# Patient Record
Sex: Male | Born: 1995 | Race: White | Hispanic: No | Marital: Single | Smoking: Current every day smoker
Health system: Southern US, Community
[De-identification: ages and names within clinical notes are randomized; demographics above are authoritative.]

## PROBLEM LIST (undated history)

## (undated) DIAGNOSIS — F32A Depression, unspecified: Secondary | ICD-10-CM

## (undated) DIAGNOSIS — F6381 Intermittent explosive disorder: Secondary | ICD-10-CM

## (undated) DIAGNOSIS — F988 Other specified behavioral and emotional disorders with onset usually occurring in childhood and adolescence: Secondary | ICD-10-CM

## (undated) DIAGNOSIS — G4700 Insomnia, unspecified: Secondary | ICD-10-CM

## (undated) DIAGNOSIS — F329 Major depressive disorder, single episode, unspecified: Secondary | ICD-10-CM

## (undated) DIAGNOSIS — F319 Bipolar disorder, unspecified: Secondary | ICD-10-CM

## (undated) DIAGNOSIS — F4310 Post-traumatic stress disorder, unspecified: Secondary | ICD-10-CM

## (undated) HISTORY — DX: Depression, unspecified: F32.A

## (undated) HISTORY — DX: Insomnia, unspecified: G47.00

## (undated) HISTORY — DX: Post-traumatic stress disorder, unspecified: F43.10

## (undated) HISTORY — DX: Bipolar disorder, unspecified: F31.9

## (undated) HISTORY — DX: Major depressive disorder, single episode, unspecified: F32.9

## (undated) HISTORY — DX: Intermittent explosive disorder: F63.81

## (undated) HISTORY — DX: Other specified behavioral and emotional disorders with onset usually occurring in childhood and adolescence: F98.8

---
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
## 2016-01-11 ENCOUNTER — Ambulatory Visit (INDEPENDENT_AMBULATORY_CARE_PROVIDER_SITE_OTHER): Payer: Medicaid Other | Admitting: Family Medicine

## 2016-01-11 ENCOUNTER — Encounter: Payer: Self-pay | Admitting: Family Medicine

## 2016-01-11 VITALS — BP 114/70 | HR 76 | Temp 97.9°F | Ht 72.0 in | Wt 185.4 lb

## 2016-01-11 DIAGNOSIS — F4310 Post-traumatic stress disorder, unspecified: Secondary | ICD-10-CM

## 2016-01-11 DIAGNOSIS — F419 Anxiety disorder, unspecified: Principal | ICD-10-CM

## 2016-01-11 DIAGNOSIS — F3181 Bipolar II disorder: Secondary | ICD-10-CM | POA: Insufficient documentation

## 2016-01-11 DIAGNOSIS — F329 Major depressive disorder, single episode, unspecified: Secondary | ICD-10-CM

## 2016-01-11 DIAGNOSIS — F418 Other specified anxiety disorders: Secondary | ICD-10-CM | POA: Diagnosis not present

## 2016-01-11 MED ORDER — QUETIAPINE FUMARATE ER 300 MG PO TB24: 300.0000 mg | ORAL_TABLET | Freq: Every day | ORAL | Status: DC

## 2016-01-11 MED ORDER — ESCITALOPRAM OXALATE 20 MG PO TABS: 20.0000 mg | ORAL_TABLET | Freq: Every day | ORAL | Status: DC

## 2016-01-11 NOTE — Progress Notes (Signed)
BP 114/70 mmHg  Pulse 76  Temp(Src) 97.9 F (36.6 C) (Oral)  Ht 6' (1.829 m)  Wt 185 lb 6.4 oz (84.097 kg)  BMI 25.14 kg/m2   Subjective:    Patient ID: Matthew Cooke, male    DOB: 12-Jan-1996, 20 y.o.   MRN: [id number]  HPI: Matthew Cooke is a 20 y.o. male presenting on 01/11/2016 for Depression and Anxiety   HPI Anxiety and mood disorder Patient has been having more increased episodes of hyper-energy where he stays up for a day or 2 as he was not having those last time I talked to him at all or if he didn't lasted less than an hour but now they're lasting full-day or 2 days. He then has swings where he is down and depressed for 3 or 4 days in a row. He feels like this is been worse since his been on the Lexapro. He denies any suicidal ideations now though so that has improved. He has not yet seen a counselor for this yet. He says he still having a lot of issues with sleeping.  Relevant past medical, surgical, family and social history reviewed and updated as indicated. Interim medical history since our last visit reviewed. Allergies and medications reviewed and updated.  Review of Systems  Constitutional: Negative for fever.  HENT: Negative for ear discharge and ear pain.   Eyes: Negative for discharge and visual disturbance.  Respiratory: Negative for shortness of breath and wheezing.   Cardiovascular: Negative for chest pain and leg swelling.  Gastrointestinal: Negative for abdominal pain, diarrhea and constipation.  Genitourinary: Negative for difficulty urinating.  Musculoskeletal: Negative for back pain and gait problem.  Skin: Negative for rash.  Neurological: Negative for syncope, light-headedness and headaches.  Psychiatric/Behavioral: Positive for dysphoric mood, decreased concentration and agitation. Negative for suicidal ideas, sleep disturbance and self-injury. The patient is nervous/anxious.   All other systems reviewed and are negative.   Per HPI unless  specifically indicated above     Medication List       This list is accurate as of: 01/11/16 11:41 AM.  Always use your most recent med list.               escitalopram 10 MG tablet  Commonly known as:  LEXAPRO  Take 1 tablet (10 mg total) by mouth daily.     QUEtiapine 300 MG 24 hr tablet  Commonly known as:  SEROQUEL XR  Take 1 tablet (300 mg total) by mouth at bedtime.           Objective:    BP 114/70 mmHg  Pulse 76  Temp(Src) 97.9 F (36.6 C) (Oral)  Ht 6' (1.829 m)  Wt 185 lb 6.4 oz (84.097 kg)  BMI 25.14 kg/m2  Wt Readings from Last 3 Encounters:  01/11/16 185 lb 6.4 oz (84.097 kg) (87 %*, Z = 1.14)  12/14/15 190 lb 3.2 oz (86.274 kg) (90 %*, Z = 1.28)  12/06/15 187 lb 9.6 oz (85.095 kg) (89 %*, Z = 1.21)   * Growth percentiles are based on CDC 2-20 Years data.    Physical Exam  Constitutional: He is oriented to person, place, and time. He appears well-developed and well-nourished. No distress.  Eyes: Conjunctivae and EOM are normal. Pupils are equal, round, and reactive to light. Right eye exhibits no discharge. No scleral icterus.  Neck: Neck supple. No thyromegaly present.  Cardiovascular: Normal rate, regular rhythm, normal heart sounds and intact distal pulses.   No  murmur heard. Pulmonary/Chest: Effort normal and breath sounds normal. No respiratory distress. He has no wheezes.  Musculoskeletal: Normal range of motion. He exhibits no edema.  Lymphadenopathy:    He has no cervical adenopathy.  Neurological: He is alert and oriented to person, place, and time. Coordination normal.  Skin: Skin is warm and dry. No rash noted. He is not diaphoretic.  Psychiatric: His behavior is normal. Judgment and thought content normal. His mood appears anxious. His affect is labile. He exhibits a depressed mood. He expresses no suicidal ideation. He expresses no suicidal plans.  Nursing note and vitals reviewed.   No results found for this or any previous visit.      Assessment & Plan:   Problem List Items Addressed This Visit      Other   Anxiety and depression - Primary   Relevant Medications   QUEtiapine (SEROQUEL XR) 300 MG 24 hr tablet   PTSD (post-traumatic stress disorder)   Relevant Medications   QUEtiapine (SEROQUEL XR) 300 MG 24 hr tablet   Bipolar 2 disorder (HCC)   Relevant Medications   QUEtiapine (SEROQUEL XR) 300 MG 24 hr tablet       Follow up plan: Return in about 4 weeks (around 02/08/2016), or if symptoms worsen or fail to improve, for Recheck bipolar and anxiety and depression.  Counseling provided for all of the vaccine components No orders of the defined types were placed in this encounter.    Arville Care, MD [facility] 01/11/2016, 11:41 AM

## 2016-01-12 ENCOUNTER — Telehealth: Payer: Self-pay

## 2016-01-12 NOTE — Telephone Encounter (Signed)
Medicaid authorized Seroquel XR  [id number] till 01/06/17

## 2016-02-13 ENCOUNTER — Ambulatory Visit: Payer: Medicaid Other | Admitting: Family Medicine

## 2016-02-14 ENCOUNTER — Encounter: Payer: Self-pay | Admitting: Family Medicine

## 2016-04-04 ENCOUNTER — Other Ambulatory Visit: Payer: Self-pay | Admitting: Family Medicine

## 2016-04-04 DIAGNOSIS — F419 Anxiety disorder, unspecified: Principal | ICD-10-CM

## 2016-04-04 DIAGNOSIS — F4310 Post-traumatic stress disorder, unspecified: Secondary | ICD-10-CM

## 2016-04-04 DIAGNOSIS — F329 Major depressive disorder, single episode, unspecified: Secondary | ICD-10-CM

## 2016-04-04 DIAGNOSIS — F3181 Bipolar II disorder: Secondary | ICD-10-CM

## 2016-04-05 ENCOUNTER — Other Ambulatory Visit: Payer: Self-pay | Admitting: Family Medicine

## 2016-04-05 DIAGNOSIS — F419 Anxiety disorder, unspecified: Principal | ICD-10-CM

## 2016-04-05 DIAGNOSIS — F3181 Bipolar II disorder: Secondary | ICD-10-CM

## 2016-04-05 DIAGNOSIS — F4310 Post-traumatic stress disorder, unspecified: Secondary | ICD-10-CM

## 2016-04-05 DIAGNOSIS — F32A Depression, unspecified: Secondary | ICD-10-CM

## 2016-04-05 DIAGNOSIS — F329 Major depressive disorder, single episode, unspecified: Secondary | ICD-10-CM

## 2016-04-08 ENCOUNTER — Other Ambulatory Visit: Payer: Self-pay | Admitting: Family Medicine

## 2016-04-08 DIAGNOSIS — F3181 Bipolar II disorder: Secondary | ICD-10-CM

## 2016-04-08 DIAGNOSIS — F419 Anxiety disorder, unspecified: Principal | ICD-10-CM

## 2016-04-08 DIAGNOSIS — F4310 Post-traumatic stress disorder, unspecified: Secondary | ICD-10-CM

## 2016-04-08 DIAGNOSIS — F329 Major depressive disorder, single episode, unspecified: Secondary | ICD-10-CM

## 2016-06-02 ENCOUNTER — Other Ambulatory Visit: Payer: Self-pay | Admitting: Family Medicine

## 2016-06-02 DIAGNOSIS — F419 Anxiety disorder, unspecified: Principal | ICD-10-CM

## 2016-06-02 DIAGNOSIS — F4310 Post-traumatic stress disorder, unspecified: Secondary | ICD-10-CM

## 2016-06-02 DIAGNOSIS — F329 Major depressive disorder, single episode, unspecified: Secondary | ICD-10-CM

## 2016-06-02 DIAGNOSIS — F3181 Bipolar II disorder: Secondary | ICD-10-CM

## 2016-06-19 ENCOUNTER — Encounter: Payer: Self-pay | Admitting: Pediatrics

## 2016-06-19 ENCOUNTER — Ambulatory Visit (INDEPENDENT_AMBULATORY_CARE_PROVIDER_SITE_OTHER): Payer: Medicaid Other | Admitting: Pediatrics

## 2016-06-19 ENCOUNTER — Ambulatory Visit: Payer: Medicaid Other | Admitting: Family Medicine

## 2016-06-19 VITALS — BP 121/72 | HR 108 | Temp 99.7°F | Ht 72.0 in | Wt 206.0 lb

## 2016-06-19 DIAGNOSIS — F419 Anxiety disorder, unspecified: Secondary | ICD-10-CM

## 2016-06-19 DIAGNOSIS — J029 Acute pharyngitis, unspecified: Secondary | ICD-10-CM

## 2016-06-19 DIAGNOSIS — R52 Pain, unspecified: Secondary | ICD-10-CM | POA: Diagnosis not present

## 2016-06-19 DIAGNOSIS — F418 Other specified anxiety disorders: Secondary | ICD-10-CM | POA: Diagnosis not present

## 2016-06-19 DIAGNOSIS — J101 Influenza due to other identified influenza virus with other respiratory manifestations: Secondary | ICD-10-CM | POA: Diagnosis not present

## 2016-06-19 DIAGNOSIS — J020 Streptococcal pharyngitis: Secondary | ICD-10-CM | POA: Diagnosis not present

## 2016-06-19 DIAGNOSIS — F329 Major depressive disorder, single episode, unspecified: Secondary | ICD-10-CM

## 2016-06-19 LAB — VERITOR FLU A/B WAIVED
INFLUENZA A: POSITIVE — AB
INFLUENZA B: NEGATIVE

## 2016-06-19 LAB — RAPID STREP SCREEN (MED CTR MEBANE ONLY): Strep Gp A Ag, IA W/Reflex: POSITIVE — AB

## 2016-06-19 MED ORDER — OSELTAMIVIR PHOSPHATE 75 MG PO CAPS: 75.0000 mg | ORAL_CAPSULE | Freq: Two times a day (BID) | ORAL | 0 refills | Status: DC

## 2016-06-19 MED ORDER — AMOXICILLIN 500 MG PO CAPS: 500.0000 mg | ORAL_CAPSULE | Freq: Two times a day (BID) | ORAL | 0 refills | Status: AC

## 2016-06-19 NOTE — Progress Notes (Signed)
  Subjective:   Patient ID: Matthew Cooke, male    DOB: [birth year], 20 y.o.   MRN: [id number] CC: Cough; Fever; and Chills  HPI: Matthew Cooke is a 20 y.o. male presenting for Cough; Fever; and Chills  Started getting sick 3 days ago Fevers at home, subjective Coughing bothering him the most Feeling weak and tired Drinking some Throat started getting sore two days  Mood has been down Feels safe at home Has had thoughts of not wanting to be here anymore several weeks ago   [facility] 06/19/2016 01/11/2016 12/14/2015 12/06/2015  Decreased Interest 3 3 3 3   Down, Depressed, Hopeless 3 3 3 3   PHQ - 2 Score 6 6 6 6   Altered sleeping 2 3 1 2   Tired, decreased energy 2 3 3 3   Change in appetite 2 0 2 2  Feeling bad or failure about yourself  3 3 3 3   Trouble concentrating 2 2 3 3   Moving slowly or fidgety/restless - 3 3 2   Suicidal thoughts 2 2 3 2   PHQ-9 Score 19 22 24 23   Difficult doing work/chores Very difficult Very difficult Extremely dIfficult -      Relevant past medical, surgical, family and social history reviewed. Allergies and medications reviewed and updated. History  Smoking Status  . Never Smoker  Smokeless Tobacco  . Not on file   ROS: Per HPI   Objective:    BP 121/72   Pulse (!) 108   Temp 99.7 F (37.6 C) (Oral)   Ht 6' (1.829 m)   Wt 206 lb (93.4 kg)   BMI 27.94 kg/m   Wt Readings from Last 3 Encounters:  06/19/16 206 lb (93.4 kg) (95 %, Z= 1.61)*  01/11/16 185 lb 6.4 oz (84.1 kg) (87 %, Z= 1.14)*  12/14/15 190 lb 3.2 oz (86.3 kg) (90 %, Z= 1.28)*   * Growth percentiles are based on CDC 2-20 Years data.    Gen: NAD, alert, cooperative with exam, NCAT EYES: EOMI, no conjunctival injection, or no icterus ENT:  TMs pink b/l, OP with mild erythema LYMPH: no cervical LAD CV: NRRR, normal S1/S2, no murmur Resp: CTABL, no wheezes, normal WOB Neuro: Alert and oriented Psych: normal affect, no thoughts of self harm  Assessment &  Plan:  Matthew NeedleMichael was seen today for cough, fever and chills. Positive for flu and strep Will treat as below Discussed symptomatic care. Also with ongoing depression Feels safe at home, symptoms poorly controlled Will start above treatments, RTC end of week for eval of depression Pt to call us, crisis numbers, go to ED if any worsening of symptoms.  Diagnoses and all orders for this visit:  Influenza A -     oseltamivir (TAMIFLU) 75 MG capsule; Take 1 capsule (75 mg total) by mouth 2 (two) times daily.  Body aches -     Veritor Flu A/B Waived  Sore throat -     Rapid strep screen ([facility])  Streptococcal sore throat -     amoxicillin (AMOXIL) 500 MG capsule; Take 1 capsule (500 mg total) by mouth 2 (two) times daily.  Anxiety and depression   Follow up plan: Friday as scheduled Rex Krasarol Carlisa Eble, MD [facility]

## 2016-06-22 ENCOUNTER — Ambulatory Visit: Payer: Medicaid Other | Admitting: Pediatrics

## 2016-06-26 ENCOUNTER — Ambulatory Visit: Payer: Medicaid Other | Admitting: Pediatrics

## 2016-06-26 ENCOUNTER — Encounter: Payer: Self-pay | Admitting: Family Medicine

## 2016-06-27 ENCOUNTER — Encounter: Payer: Self-pay | Admitting: Family Medicine

## 2016-08-03 ENCOUNTER — Other Ambulatory Visit: Payer: Self-pay | Admitting: Family Medicine

## 2016-08-03 DIAGNOSIS — F419 Anxiety disorder, unspecified: Principal | ICD-10-CM

## 2016-08-03 DIAGNOSIS — F4310 Post-traumatic stress disorder, unspecified: Secondary | ICD-10-CM

## 2016-08-03 DIAGNOSIS — F329 Major depressive disorder, single episode, unspecified: Secondary | ICD-10-CM

## 2016-08-03 DIAGNOSIS — F3181 Bipolar II disorder: Secondary | ICD-10-CM

## 2016-08-03 DIAGNOSIS — F32A Depression, unspecified: Secondary | ICD-10-CM

## 2016-08-05 NOTE — Telephone Encounter (Signed)
We may give one months worth, he needs appt asap

## 2016-08-06 NOTE — Telephone Encounter (Signed)
LM NTBS before next refill 

## 2016-08-21 ENCOUNTER — Encounter: Payer: Self-pay | Admitting: Physician Assistant

## 2016-08-21 ENCOUNTER — Ambulatory Visit (INDEPENDENT_AMBULATORY_CARE_PROVIDER_SITE_OTHER): Payer: Medicaid Other | Admitting: Physician Assistant

## 2016-08-21 VITALS — BP 112/72 | HR 96 | Temp 99.5°F | Ht 72.02 in | Wt 205.4 lb

## 2016-08-21 DIAGNOSIS — J101 Influenza due to other identified influenza virus with other respiratory manifestations: Secondary | ICD-10-CM | POA: Diagnosis not present

## 2016-08-21 DIAGNOSIS — R52 Pain, unspecified: Secondary | ICD-10-CM

## 2016-08-21 LAB — VERITOR FLU A/B WAIVED
INFLUENZA A: NEGATIVE
Influenza B: POSITIVE — AB

## 2016-08-21 MED ORDER — OSELTAMIVIR PHOSPHATE 75 MG PO CAPS: 75.0000 mg | ORAL_CAPSULE | Freq: Two times a day (BID) | ORAL | 0 refills | Status: DC

## 2016-08-21 NOTE — Progress Notes (Signed)
BP 112/72   Pulse 96   Temp 99.5 F (37.5 C) (Oral)   Ht 6' 0.02" (1.829 m)   Wt 205 lb 6.4 oz (93.2 kg)   BMI 27.84 kg/m    Subjective:    Patient ID: Matthew Cooke, male    DOB: 1995-07-24, 21 y.o.   MRN: [id number]  HPI: Matthew Cooke is a 21 y.o. male presenting on 08/21/2016 for Generalized Body Aches; Fever; Cough; Chills; and Diarrhea  This patient has had less than 2 days severe fever, chills, myalgias.  Complains of sinus headache and postnasal drainage. There is copious drainage at times. Associated sore throat, decreased appetite and headache.  Has been exposed to influenza.   Relevant past medical, surgical, family and social history reviewed and updated as indicated. Allergies and medications reviewed and updated.  Past Medical History:  Diagnosis Date  . Attention deficit disorder   . Bipolar 1 disorder (HCC)   . Depression   . Insomnia   . Intermittent explosive disorder   . PTSD (post-traumatic stress disorder)     History reviewed. No pertinent surgical history.  Review of Systems  Constitutional: Positive for activity change, fatigue and fever. Negative for appetite change.  HENT: Positive for congestion and sore throat. Negative for sinus pressure.   Eyes: Negative.  Negative for pain and visual disturbance.  Respiratory: Negative for cough, chest tightness, shortness of breath and wheezing.   Cardiovascular: Negative.  Negative for chest pain, palpitations and leg swelling.  Gastrointestinal: Positive for nausea and vomiting. Negative for abdominal pain and diarrhea.  Endocrine: Negative.   Genitourinary: Negative.   Musculoskeletal: Positive for back pain and myalgias. Negative for arthralgias.  Skin: Negative.  Negative for color change and rash.  Neurological: Positive for headaches. Negative for weakness and numbness.  Psychiatric/Behavioral: Negative.     Allergies as of 08/21/2016   No Known Allergies     Medication List         Accurate as of 08/21/16 10:32 AM. Always use your most recent med list.          escitalopram 20 MG tablet Commonly known as:  LEXAPRO Take 1 tablet (20 mg total) by mouth daily.   oseltamivir 75 MG capsule Commonly known as:  TAMIFLU Take 1 capsule (75 mg total) by mouth 2 (two) times daily.   SEROQUEL XR 300 MG 24 hr tablet Generic drug:  QUEtiapine TAKE 1 TABLET (300 MG TOTAL) BY MOUTH AT BEDTIME.          Objective:    BP 112/72   Pulse 96   Temp 99.5 F (37.5 C) (Oral)   Ht 6' 0.02" (1.829 m)   Wt 205 lb 6.4 oz (93.2 kg)   BMI 27.84 kg/m   No Known Allergies  Physical Exam  Constitutional: He is oriented to person, place, and time. He appears well-developed and well-nourished. He appears distressed.  HENT:  Head: Normocephalic and atraumatic.  Right Ear: Tympanic membrane normal. No drainage. No middle ear effusion.  Left Ear: Tympanic membrane normal. No drainage.  No middle ear effusion.  Nose: Mucosal edema and rhinorrhea present. Right sinus exhibits no maxillary sinus tenderness. Left sinus exhibits no maxillary sinus tenderness.  Mouth/Throat: Uvula is midline. Posterior oropharyngeal erythema present. No oropharyngeal exudate.  Eyes: Conjunctivae and EOM are normal. Pupils are equal, round, and reactive to light. Right eye exhibits no discharge. Left eye exhibits no discharge.  Neck: Normal range of motion.  Cardiovascular: Normal rate, regular rhythm  and normal heart sounds.   Pulmonary/Chest: Effort normal and breath sounds normal. No respiratory distress. He has no wheezes.  Abdominal: Soft.  Lymphadenopathy:    He has no cervical adenopathy.  Neurological: He is alert and oriented to person, place, and time.  Skin: Skin is warm and dry.  Psychiatric: He has a normal mood and affect. His behavior is normal.  Nursing note and vitals reviewed.   Results for orders placed or performed in visit on 06/19/16  Veritor Flu A/B Waived  Result Value Ref  Range   Influenza A Positive (A) Negative   Influenza B Negative Negative  [facility]apid [facility] screen (not [facility])  Result Value Ref Range   Strep Gp A Ag, IA W/Reflex Positive (A) Negative      Assessment & Plan:   1. Body aches - Veritor Flu A/B Waived  2. Influenza B - oseltamivir (TAMIFLU) 75 MG capsule; Take 1 capsule (75 mg total) by mouth 2 (two) times daily.  Dispense: 10 capsule; Refill: 0   Continue all other maintenance medications as listed above.  Follow up plan: Return if symptoms worsen or fail to improve.  Educational handout given for influenza  Remus LofflerAngel S. Kaybree Williams PA-C [facility] [address]  [place] [id number] [phone]   08/21/2016, 10:32 AM

## 2016-08-21 NOTE — Patient Instructions (Signed)

## 2016-08-23 ENCOUNTER — Ambulatory Visit (INDEPENDENT_AMBULATORY_CARE_PROVIDER_SITE_OTHER): Payer: Medicaid Other | Admitting: Family Medicine

## 2016-08-23 ENCOUNTER — Encounter: Payer: Self-pay | Admitting: Family Medicine

## 2016-08-23 VITALS — BP 115/64 | HR 77 | Temp 97.6°F | Ht 72.0 in | Wt 204.4 lb

## 2016-08-23 DIAGNOSIS — F419 Anxiety disorder, unspecified: Principal | ICD-10-CM

## 2016-08-23 DIAGNOSIS — F4310 Post-traumatic stress disorder, unspecified: Secondary | ICD-10-CM

## 2016-08-23 DIAGNOSIS — F418 Other specified anxiety disorders: Secondary | ICD-10-CM | POA: Diagnosis not present

## 2016-08-23 DIAGNOSIS — F3181 Bipolar II disorder: Secondary | ICD-10-CM

## 2016-08-23 DIAGNOSIS — F32A Depression, unspecified: Secondary | ICD-10-CM

## 2016-08-23 DIAGNOSIS — F329 Major depressive disorder, single episode, unspecified: Secondary | ICD-10-CM

## 2016-08-23 MED ORDER — QUETIAPINE FUMARATE ER 300 MG PO TB24
600.0000 mg | ORAL_TABLET | Freq: Every day | ORAL | 2 refills | Status: DC
Start: 2016-08-23 — End: 2016-11-20

## 2016-08-23 MED ORDER — ESCITALOPRAM OXALATE 20 MG PO TABS: 20.0000 mg | ORAL_TABLET | Freq: Every day | ORAL | 2 refills | Status: AC
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
## 2016-08-28 ENCOUNTER — Ambulatory Visit (INDEPENDENT_AMBULATORY_CARE_PROVIDER_SITE_OTHER): Payer: Medicaid Other | Admitting: Family Medicine

## 2016-08-28 ENCOUNTER — Ambulatory Visit (INDEPENDENT_AMBULATORY_CARE_PROVIDER_SITE_OTHER): Payer: Medicaid Other

## 2016-08-28 ENCOUNTER — Encounter: Payer: Self-pay | Admitting: Family Medicine

## 2016-08-28 VITALS — BP 111/59 | HR 99 | Temp 98.4°F | Ht 72.0 in | Wt 206.0 lb

## 2016-08-28 DIAGNOSIS — R1032 Left lower quadrant pain: Secondary | ICD-10-CM | POA: Diagnosis not present

## 2016-08-28 DIAGNOSIS — R195 Other fecal abnormalities: Secondary | ICD-10-CM | POA: Diagnosis not present

## 2016-08-28 LAB — URINALYSIS, COMPLETE
Bilirubin, UA: NEGATIVE
Glucose, UA: NEGATIVE
Ketones, UA: NEGATIVE
LEUKOCYTES UA: NEGATIVE
Nitrite, UA: NEGATIVE
PH UA: 8.5 — AB (ref 5.0–7.5)
Protein, UA: NEGATIVE
RBC, UA: NEGATIVE
Specific Gravity, UA: 1.015 (ref 1.005–1.030)
Urobilinogen, Ur: 0.2 mg/dL (ref 0.2–1.0)

## 2016-08-28 LAB — MICROSCOPIC EXAMINATION
Bacteria, UA: NONE SEEN
EPITHELIAL CELLS (NON RENAL): NONE SEEN /HPF (ref 0–10)
RBC, UA: NONE SEEN /hpf (ref 0–?)
RENAL EPITHEL UA: NONE SEEN /HPF
WBC, UA: NONE SEEN /hpf (ref 0–?)

## 2016-08-28 MED ORDER — POLYETHYLENE GLYCOL 3350 17 GM/SCOOP PO POWD: 17.0000 g | Freq: Two times a day (BID) | ORAL | 5 refills | Status: AC | PRN

## 2016-08-28 NOTE — Progress Notes (Signed)
Subjective:  Patient ID: Matthew Cooke, male    DOB: 10/16/1995  Age: 21 y.o. MRN: [id number]  CC: Flank Pain (pt here today c/o left side pain since last night.)   HPI Matthew Cooke presents for Onset yesterday afternoon point specific left lower quadrant pain. He has had a slow increase through the evening yesterday and overnight until pain crescendoed this morning. He describes a sharp pain. It is located just adjacent to the anterior superior iliac spine on the left. It hurts to touch. It hurts when he bends forward or straightens his abdomen at the waist. He's not had any nausea vomiting diarrhea or constipation. No injury. History Matthew Cooke has a past medical history of Attention deficit disorder; Bipolar 1 disorder ([place]); Depression; Insomnia; Intermittent explosive disorder; and PTSD (post-traumatic stress disorder).   He has no past surgical history on file.   His family history includes Bipolar disorder in his mother; Depression in his mother and sister; Insomnia in his mother; Post-traumatic stress disorder in his mother; Schizophrenia in his mother.He reports that he has been smoking.  He has never used smokeless tobacco. He reports that he does not drink alcohol or use drugs.    ROS Review of Systems  Constitutional: Negative for chills, diaphoresis, fever and unexpected weight change.  HENT: Negative for rhinorrhea and trouble swallowing.   Respiratory: Negative for cough, chest tightness and shortness of breath.   Cardiovascular: Negative for chest pain.  Gastrointestinal: Positive for abdominal pain. Negative for abdominal distention, blood in stool, constipation, diarrhea, nausea, rectal pain and vomiting.  Genitourinary: Negative for dysuria, flank pain and hematuria.  Musculoskeletal: Negative for arthralgias and joint swelling.  Skin: Negative for rash.  Neurological: Negative for syncope and headaches.    Objective:  BP (!) 111/59   Pulse 99   Temp 98.4 F  (36.9 C) (Oral)   Ht 6' (1.829 m)   Wt 206 lb (93.4 kg)   BMI 27.94 kg/m   BP Readings from Last 3 Encounters:  08/28/16 (!) 111/59  08/23/16 115/64  08/21/16 112/72    Wt Readings from Last 3 Encounters:  08/28/16 206 lb (93.4 kg) (94 %, Z= 1.59)*  08/23/16 204 lb 6.4 oz (92.7 kg) (94 %, Z= 1.56)*  08/21/16 205 lb 6.4 oz (93.2 kg) (94 %, Z= 1.58)*   * Growth percentiles are based on CDC 2-20 Years data.     Physical Exam  Constitutional: He is oriented to person, place, and time. He appears well-developed and well-nourished. He appears distressed (due to lower abdominal pain.).  HENT:  Head: Normocephalic and atraumatic.  Right Ear: Tympanic membrane and external ear normal. No decreased hearing is noted.  Left Ear: Tympanic membrane and external ear normal. No decreased hearing is noted.  Mouth/Throat: No oropharyngeal exudate or posterior oropharyngeal erythema.  Eyes: Pupils are equal, round, and reactive to light.  Neck: Normal range of motion. Neck supple.  Cardiovascular: Normal rate and regular rhythm.   No murmur heard. Pulmonary/Chest: Breath sounds normal. No respiratory distress.  Abdominal: Soft. Bowel sounds are normal. He exhibits no mass. There is tenderness. There is rebound and guarding.  Musculoskeletal: Normal range of motion.  Neurological: He is alert and oriented to person, place, and time.  Skin: Skin is warm and dry.  Psychiatric: He has a normal mood and affect.  Vitals reviewed.   Patient was never admitted.  Assessment & Plan:   Kentarius was seen today for flank pain.  Diagnoses and all orders for  this visit:  LLQ pain -     CBC with Differential/Platelet -     CMP14+EGFR -     DG Abd 2 Views; Future -     Urinalysis, Complete  Increased stool volume  Other orders -     polyethylene glycol powder (GLYCOLAX/MIRALAX) powder; Take 17 g by mouth 2 (two) times daily as needed for moderate constipation. For constipation     I am  having Mr. Urbanik start on polyethylene glycol powder. I am also having him maintain his QUEtiapine and escitalopram.  Allergies as of 08/28/2016   No Known Allergies     Medication List       Accurate as of 08/28/16  5:31 PM. Always use your most recent med list.          escitalopram 20 MG tablet Commonly known as:  LEXAPRO Take 1 tablet (20 mg total) by mouth daily.   polyethylene glycol powder powder Commonly known as:  GLYCOLAX/MIRALAX Take 17 g by mouth 2 (two) times daily as needed for moderate constipation. For constipation   QUEtiapine 300 MG 24 hr tablet Commonly known as:  SEROQUEL XR Take 2 tablets (600 mg total) by mouth at bedtime.        Follow-up: Return if symptoms worsen or fail to improve.  Claretta Fraise, M.D.

## 2016-08-29 LAB — CMP14+EGFR
A/G RATIO: 1.9 (ref 1.2–2.2)
ALBUMIN: 4.8 g/dL (ref 3.5–5.5)
ALK PHOS: 67 IU/L (ref 39–117)
ALT: 71 IU/L — ABNORMAL HIGH (ref 0–44)
AST: 31 IU/L (ref 0–40)
BILIRUBIN TOTAL: 0.6 mg/dL (ref 0.0–1.2)
BUN / CREAT RATIO: 17 (ref 9–20)
BUN: 11 mg/dL (ref 6–20)
CHLORIDE: 101 mmol/L (ref 96–106)
CO2: 25 mmol/L (ref 18–29)
Calcium: 9.5 mg/dL (ref 8.7–10.2)
Creatinine, Ser: 0.64 mg/dL — ABNORMAL LOW (ref 0.76–1.27)
GFR calc non Af Amer: 142 mL/min/{1.73_m2} (ref >59)
GFR, EST AFRICAN AMERICAN: 164 mL/min/{1.73_m2} (ref >59)
Globulin, Total: 2.5 g/dL (ref 1.5–4.5)
Glucose: 100 mg/dL — ABNORMAL HIGH (ref 65–99)
POTASSIUM: 4.3 mmol/L (ref 3.5–5.2)
SODIUM: 143 mmol/L (ref 134–144)
TOTAL PROTEIN: 7.3 g/dL (ref 6.0–8.5)

## 2016-08-29 LAB — CBC WITH DIFFERENTIAL/PLATELET
BASOS: 0 %
Basophils Absolute: 0 10*3/uL (ref 0.0–0.2)
EOS (ABSOLUTE): 0.3 10*3/uL (ref 0.0–0.4)
Eos: 4 %
HEMOGLOBIN: 15.3 g/dL (ref 13.0–17.7)
Hematocrit: 43.4 % (ref 37.5–51.0)
IMMATURE GRANS (ABS): 0 10*3/uL (ref 0.0–0.1)
Immature Granulocytes: 0 %
LYMPHS ABS: 2.2 10*3/uL (ref 0.7–3.1)
LYMPHS: 30 %
MCH: 30.1 pg (ref 26.6–33.0)
MCHC: 35.3 g/dL (ref 31.5–35.7)
MCV: 85 fL (ref 79–97)
MONOCYTES: 10 %
Monocytes Absolute: 0.7 10*3/uL (ref 0.1–0.9)
NEUTROS ABS: 4.2 10*3/uL (ref 1.4–7.0)
Neutrophils: 56 %
Platelets: 350 10*3/uL (ref 150–379)
RBC: 5.08 x10E6/uL (ref 4.14–5.80)
RDW: 13.6 % (ref 12.3–15.4)
WBC: 7.5 10*3/uL (ref 3.4–10.8)

## 2016-09-07 ENCOUNTER — Telehealth (HOSPITAL_COMMUNITY): Payer: Self-pay | Admitting: *Deleted

## 2016-09-07 NOTE — Telephone Encounter (Signed)
[place]  call back at 3:40.

## 2016-10-11 ENCOUNTER — Telehealth (HOSPITAL_COMMUNITY): Payer: Self-pay | Admitting: *Deleted

## 2016-10-11 NOTE — Telephone Encounter (Signed)
Left voice message regarding an appointment. 

## 2016-11-20 ENCOUNTER — Other Ambulatory Visit: Payer: Self-pay | Admitting: Family Medicine

## 2016-11-20 DIAGNOSIS — F3181 Bipolar II disorder: Secondary | ICD-10-CM

## 2016-11-20 DIAGNOSIS — F329 Major depressive disorder, single episode, unspecified: Secondary | ICD-10-CM

## 2016-11-20 DIAGNOSIS — F419 Anxiety disorder, unspecified: Principal | ICD-10-CM

## 2016-11-20 DIAGNOSIS — F4310 Post-traumatic stress disorder, unspecified: Secondary | ICD-10-CM

## 2016-11-20 DIAGNOSIS — F32A Depression, unspecified: Secondary | ICD-10-CM

## 2016-12-15 ENCOUNTER — Other Ambulatory Visit: Payer: Self-pay | Admitting: Family Medicine

## 2016-12-15 DIAGNOSIS — F329 Major depressive disorder, single episode, unspecified: Secondary | ICD-10-CM

## 2016-12-15 DIAGNOSIS — F4310 Post-traumatic stress disorder, unspecified: Secondary | ICD-10-CM

## 2016-12-15 DIAGNOSIS — F3181 Bipolar II disorder: Secondary | ICD-10-CM

## 2016-12-15 DIAGNOSIS — F32A Depression, unspecified: Secondary | ICD-10-CM

## 2016-12-15 DIAGNOSIS — F419 Anxiety disorder, unspecified: Principal | ICD-10-CM

## 2017-01-24 ENCOUNTER — Other Ambulatory Visit: Payer: Self-pay | Admitting: Family Medicine

## 2017-01-24 DIAGNOSIS — F329 Major depressive disorder, single episode, unspecified: Secondary | ICD-10-CM

## 2017-01-24 DIAGNOSIS — F3181 Bipolar II disorder: Secondary | ICD-10-CM

## 2017-01-24 DIAGNOSIS — F4310 Post-traumatic stress disorder, unspecified: Secondary | ICD-10-CM

## 2017-01-24 DIAGNOSIS — F419 Anxiety disorder, unspecified: Principal | ICD-10-CM

## 2017-01-24 DIAGNOSIS — F32A Depression, unspecified: Secondary | ICD-10-CM

## 2017-01-25 NOTE — Telephone Encounter (Signed)
lmtcb

## 2017-01-25 NOTE — Telephone Encounter (Signed)
Patient needs an appointment, we can give him enough to get through the appointment but no more.

## 2017-02-05 NOTE — Telephone Encounter (Signed)
No response from patient.  No answer today and no voice mail .

## 2017-03-01 ENCOUNTER — Other Ambulatory Visit: Payer: Self-pay | Admitting: Family Medicine

## 2017-03-01 DIAGNOSIS — F32A Depression, unspecified: Secondary | ICD-10-CM

## 2017-03-01 DIAGNOSIS — F4310 Post-traumatic stress disorder, unspecified: Secondary | ICD-10-CM

## 2017-03-01 DIAGNOSIS — F3181 Bipolar II disorder: Secondary | ICD-10-CM

## 2017-03-01 DIAGNOSIS — F329 Major depressive disorder, single episode, unspecified: Secondary | ICD-10-CM

## 2017-03-01 DIAGNOSIS — F419 Anxiety disorder, unspecified: Principal | ICD-10-CM

## 2017-03-05 NOTE — Telephone Encounter (Signed)
Last sen 08/28/16  Dr Darlyn ReadStacks

## 2017-03-05 NOTE — Telephone Encounter (Signed)
Authorize 30 days only. Then contact the patient letting them know that they will need an appointment before any further prescriptions can be sent in. 

## 2018-04-02 IMAGING — DX DG ABDOMEN 2V
3 series · 3 of 3 positions shown · non-contrast
Comparison: None.

CLINICAL DATA: Abdominal pain

EXAM:
ABDOMEN - 2 VIEW

[abdomen erect]
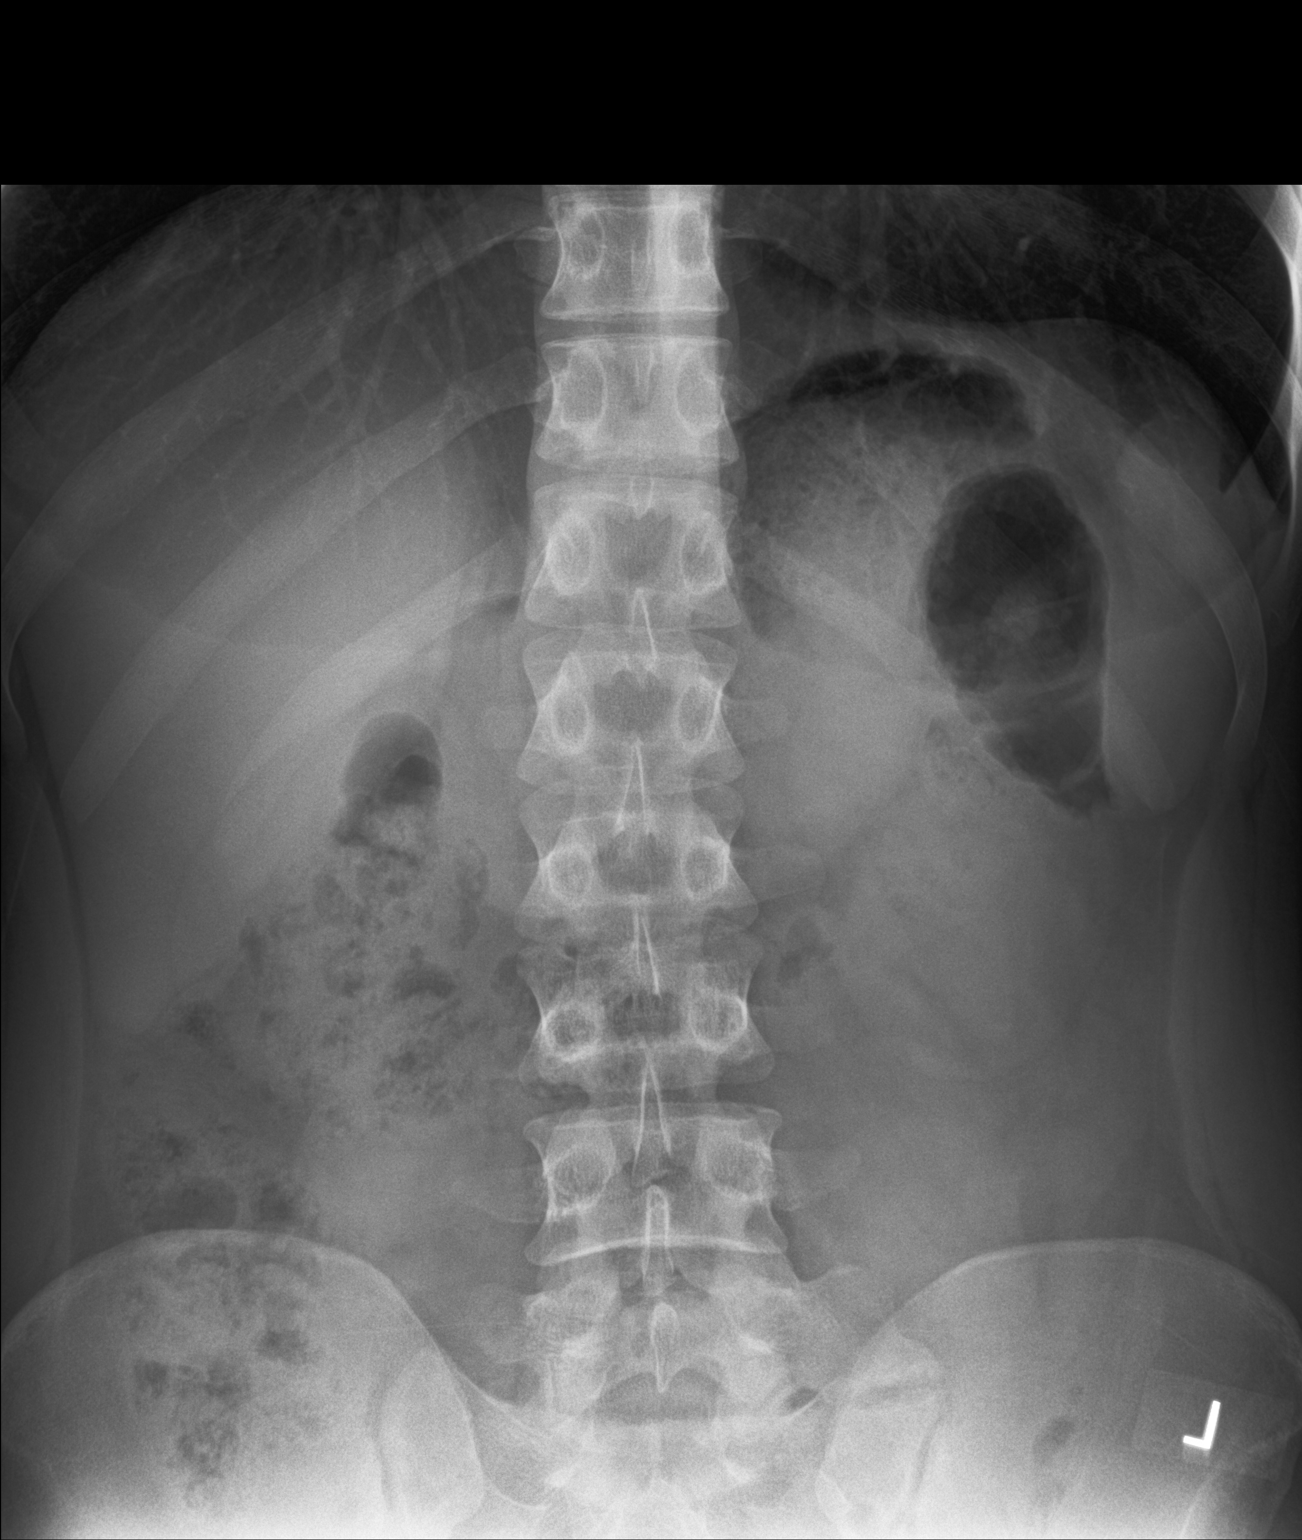

[abdomen supine (1 of 2)]
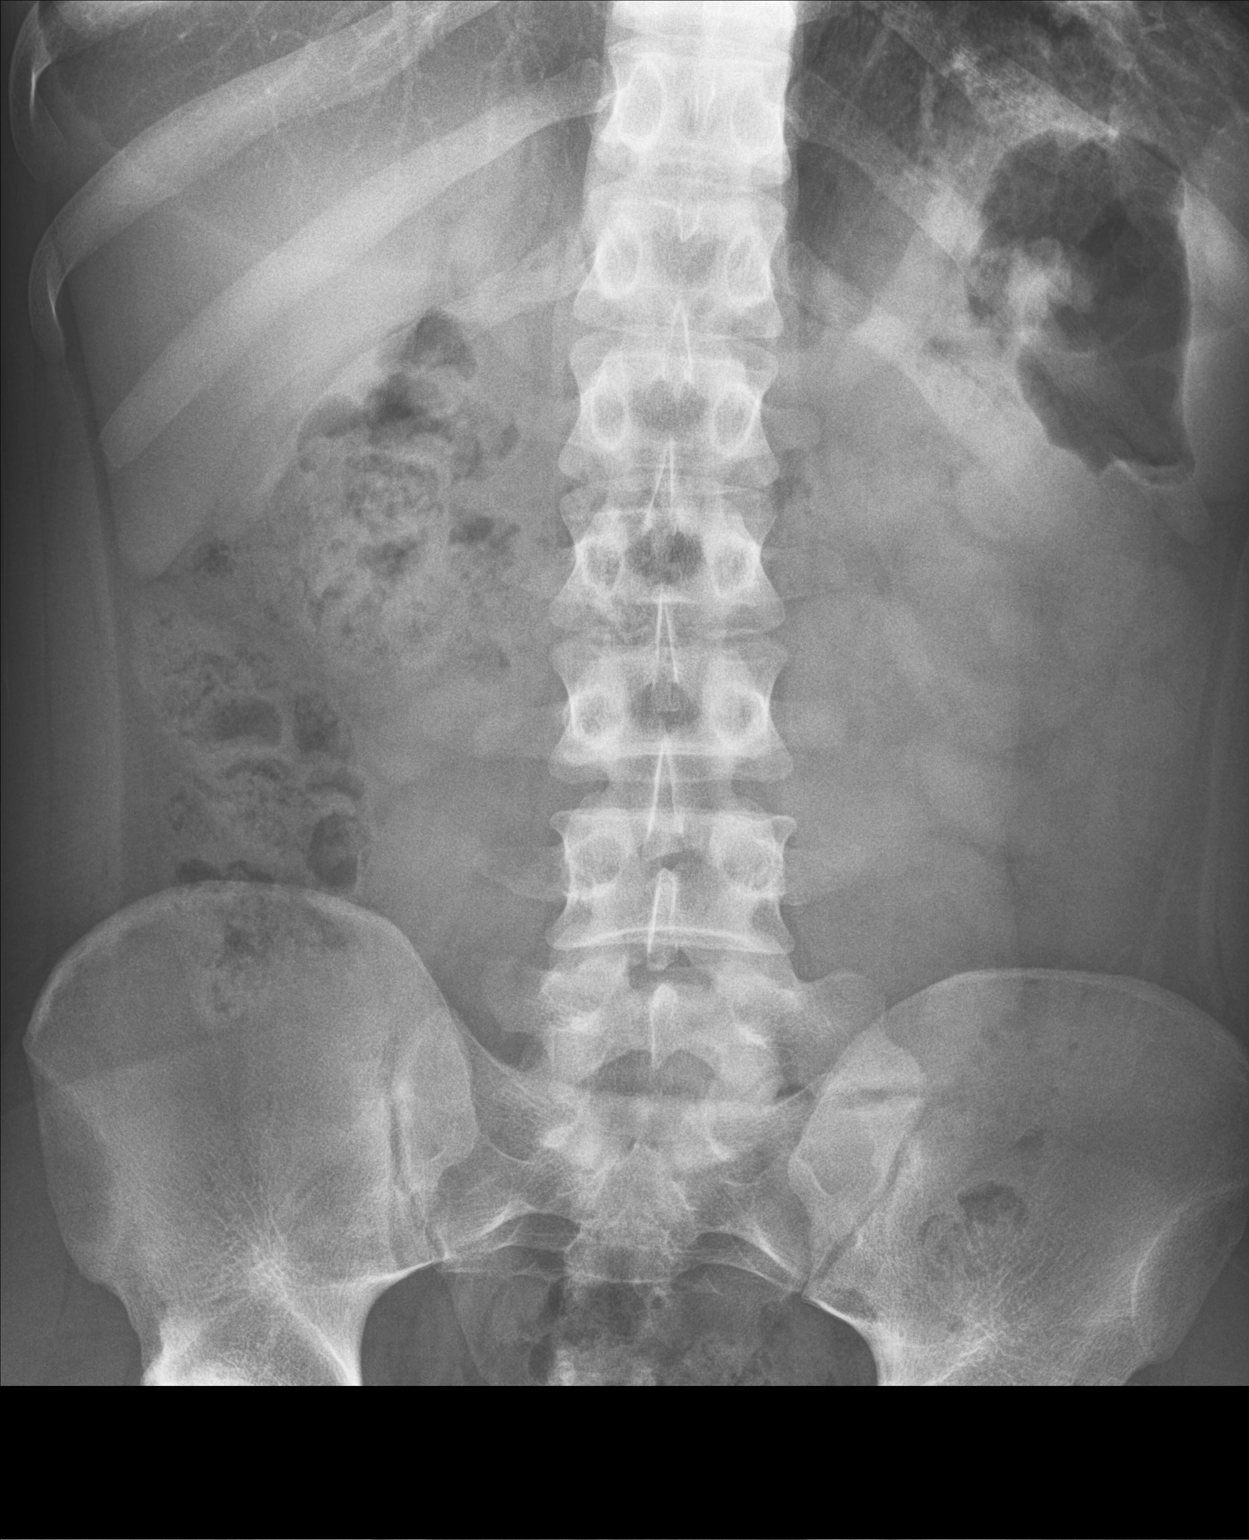

[abdomen supine (2 of 2)]
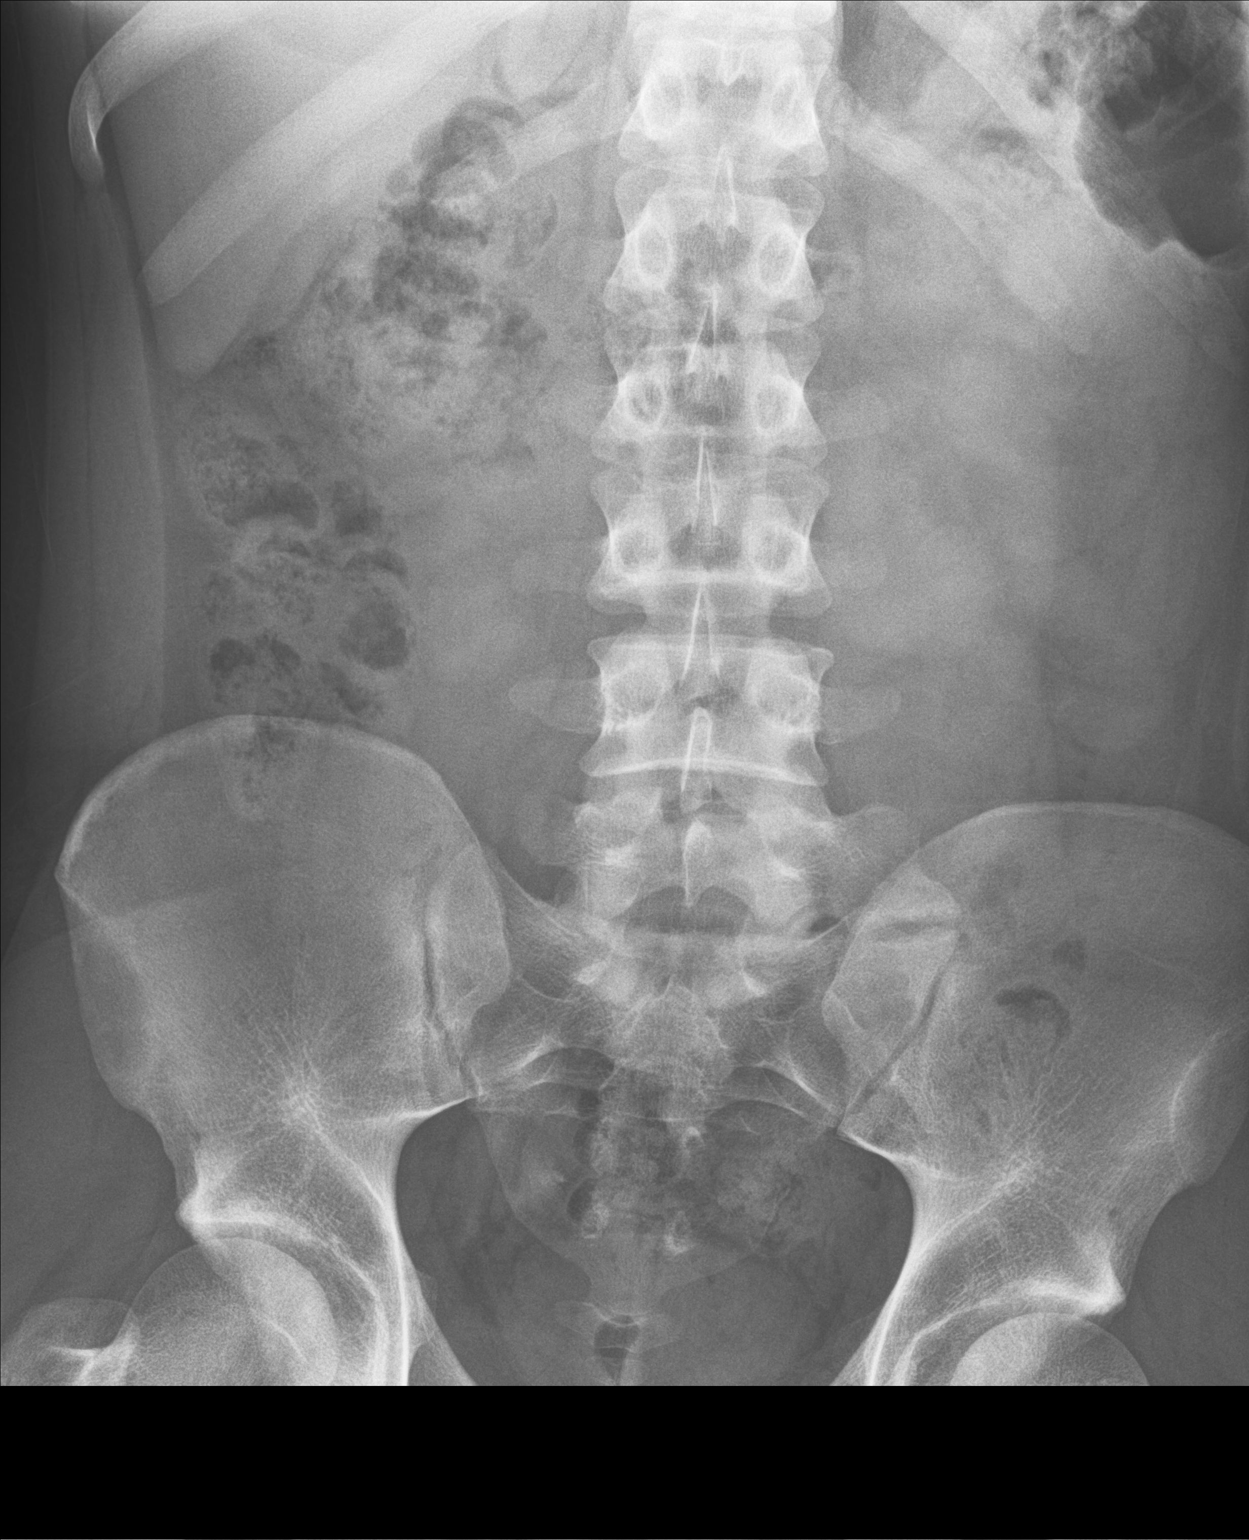

[3 of 3 positions shown; findings below may reference images not displayed]

FINDINGS: Supine and erect views the abdomen show no bowel obstruction. No
free air is seen. No opaque calculi are noted. There is no
radiographic evidence of constipation. No bony abnormality is seen.
IMPRESSION: No bowel obstruction.  No free air.
